# Patient Record
Sex: Male | Born: 1965 | Race: White | Hispanic: Yes | Marital: Married
Health system: Southern US, Community
[De-identification: ages and names within clinical notes are randomized; demographics above are authoritative.]

## PROBLEM LIST (undated history)

## (undated) HISTORY — PX: BACK SURGERY: SHX140

---

## 2000-06-10 ENCOUNTER — Emergency Department (HOSPITAL_COMMUNITY): Admission: EM | Admit: 2000-06-10 | Discharge: 2000-06-10 | Payer: Self-pay | Admitting: Emergency Medicine

## 2003-02-11 ENCOUNTER — Encounter: Payer: Self-pay | Admitting: Internal Medicine

## 2003-02-11 ENCOUNTER — Ambulatory Visit (HOSPITAL_COMMUNITY): Admission: RE | Admit: 2003-02-11 | Discharge: 2003-02-11 | Payer: Self-pay | Admitting: Internal Medicine

## 2003-02-14 ENCOUNTER — Encounter: Admission: RE | Admit: 2003-02-14 | Discharge: 2003-02-14 | Payer: Self-pay | Admitting: *Deleted

## 2003-02-14 ENCOUNTER — Encounter: Payer: Self-pay | Admitting: Internal Medicine

## 2008-09-30 ENCOUNTER — Ambulatory Visit (HOSPITAL_BASED_OUTPATIENT_CLINIC_OR_DEPARTMENT_OTHER): Admission: RE | Admit: 2008-09-30 | Discharge: 2008-09-30 | Payer: Self-pay | Admitting: Family Medicine

## 2008-09-30 ENCOUNTER — Ambulatory Visit: Payer: Self-pay | Admitting: Diagnostic Radiology

## 2010-02-12 ENCOUNTER — Ambulatory Visit (HOSPITAL_COMMUNITY)
Admission: RE | Admit: 2010-02-12 | Discharge: 2010-02-13 | Payer: Self-pay | Source: Home / Self Care | Admitting: Neurosurgery

## 2010-10-06 LAB — CBC
HCT: 46.1 % (ref 39.0–52.0)
MCV: 86.5 fL (ref 78.0–100.0)
RDW: 12.4 % (ref 11.5–15.5)
WBC: 7.1 10*3/uL (ref 4.0–10.5)

## 2010-10-06 LAB — SURGICAL PCR SCREEN: Staphylococcus aureus: POSITIVE — AB

## 2011-03-09 IMAGING — CR DG LUMBAR SPINE 2-3V
1 series · 1 of 1 positions shown · non-contrast
Comparison: None.

CLINICAL DATA: Right L5-S1 laminectomy.

LUMBAR SPINE - 2-3 VIEW

[view not recorded]
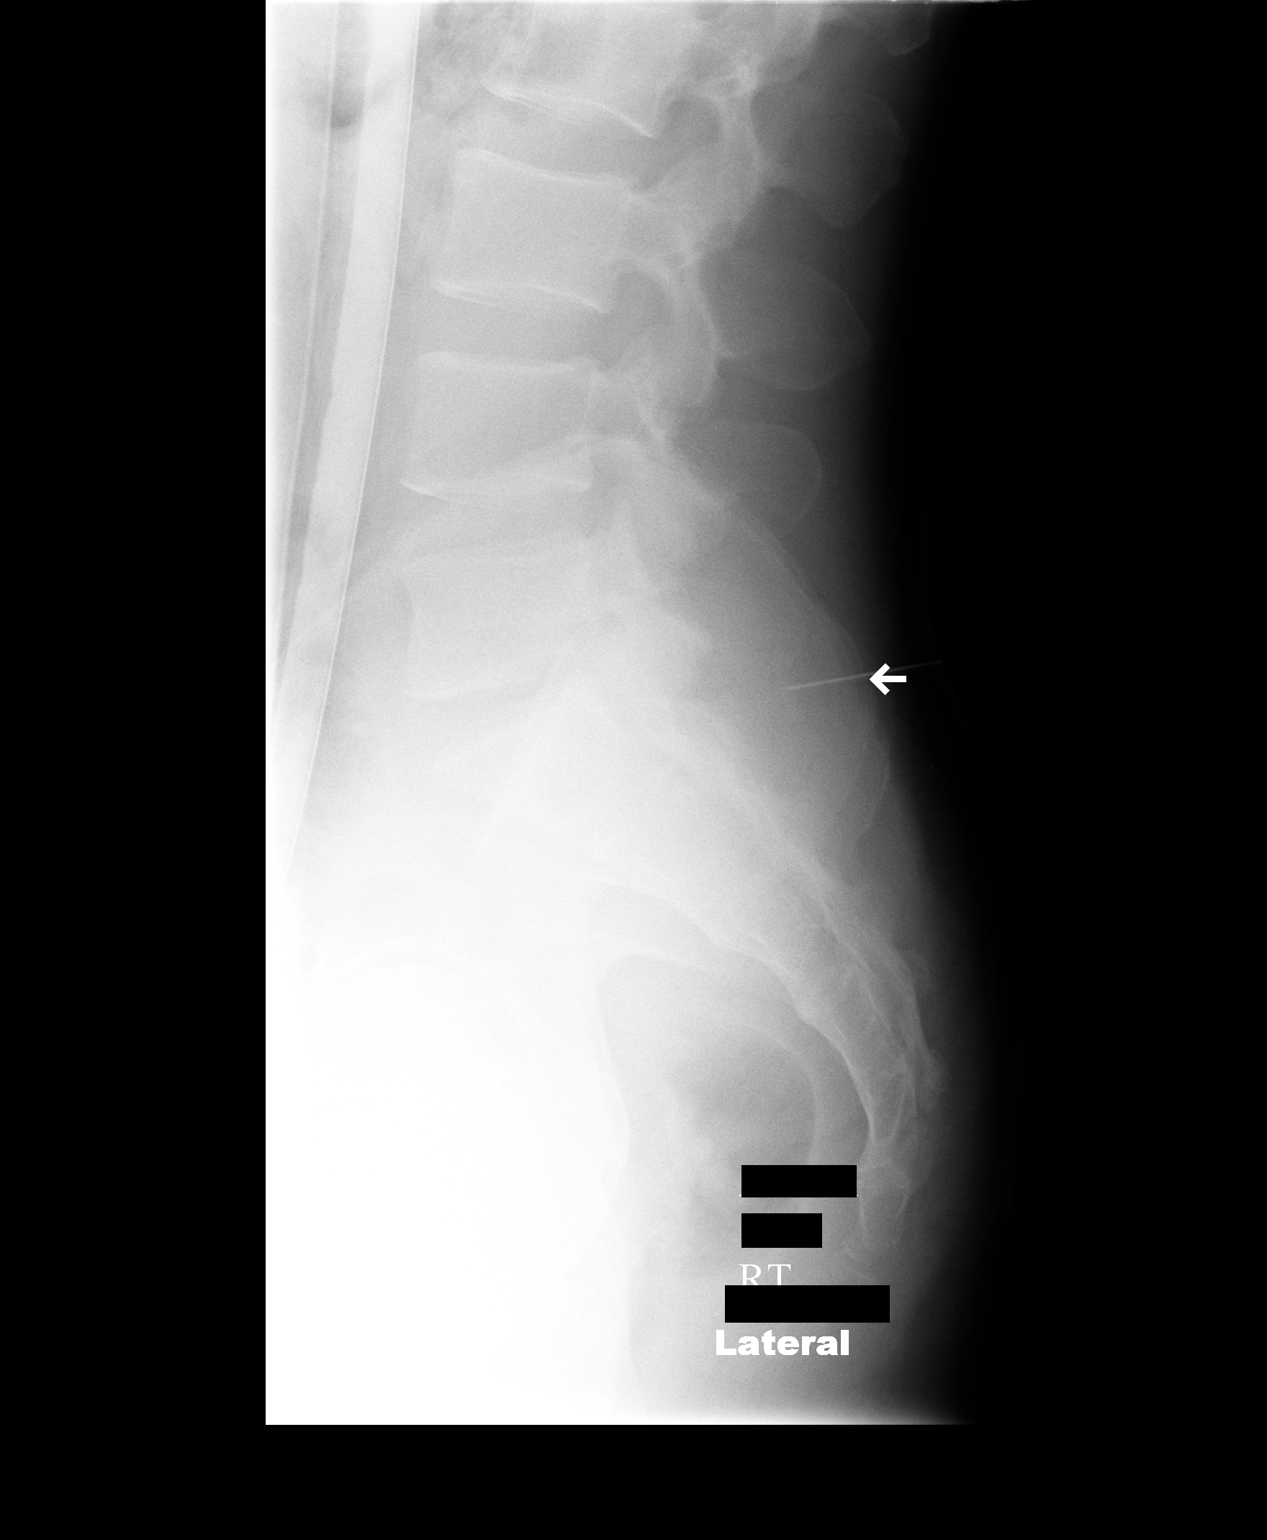

[1 of 1 positions shown; findings below may reference images not displayed]

FINDINGS: Two intraoperative cross-table lateral views of the
lumbar spine are submitted.  The first film taken at 1006 hours
shows a surgical probe tip projecting posterior to the S1 vertebral
body.  The second film taken at approximately 5046 hours shows a
surgical probe tip projecting posterior to the L5-S1 facet joint.
Osseous detail is degraded by technique.
IMPRESSION: Intraoperative localization for L5 S1 laminectomy.

## 2021-08-17 ENCOUNTER — Emergency Department (HOSPITAL_BASED_OUTPATIENT_CLINIC_OR_DEPARTMENT_OTHER)
Admission: EM | Admit: 2021-08-17 | Discharge: 2021-08-17 | Disposition: A | Discharge status: Home / Self Care | Payer: 59 | Attending: Emergency Medicine | Admitting: Emergency Medicine

## 2021-08-17 ENCOUNTER — Other Ambulatory Visit: Payer: Self-pay

## 2021-08-17 ENCOUNTER — Encounter (HOSPITAL_BASED_OUTPATIENT_CLINIC_OR_DEPARTMENT_OTHER): Payer: Self-pay | Admitting: *Deleted

## 2021-08-17 DIAGNOSIS — X58XXXA Exposure to other specified factors, initial encounter: Secondary | ICD-10-CM | POA: Diagnosis not present

## 2021-08-17 DIAGNOSIS — M542 Cervicalgia: Secondary | ICD-10-CM | POA: Diagnosis present

## 2021-08-17 DIAGNOSIS — S161XXA Strain of muscle, fascia and tendon at neck level, initial encounter: Secondary | ICD-10-CM | POA: Diagnosis not present

## 2021-08-17 MED ORDER — KETOROLAC TROMETHAMINE 15 MG/ML IJ SOLN
15.0000 mg | Freq: Once | INTRAMUSCULAR | Status: AC
Start: 2021-08-17 — End: 2021-08-17
  Administered 2021-08-17: 15 mg via INTRAMUSCULAR
  Filled 2021-08-17: qty 1

## 2021-08-17 MED ORDER — METHOCARBAMOL 500 MG PO TABS: 500.0000 mg | ORAL_TABLET | Freq: Two times a day (BID) | ORAL | 0 refills | Status: AC

## 2021-08-17 MED ORDER — NAPROXEN 375 MG PO TABS: 375.0000 mg | ORAL_TABLET | Freq: Two times a day (BID) | ORAL | 0 refills | Status: AC

## 2021-08-17 NOTE — ED Triage Notes (Signed)
C/o neck pain with movt x 4 days, denies injury

## 2021-08-17 NOTE — ED Provider Notes (Signed)
[facility] Provider Note   CSN: [id number] Arrival date & time: 08/17/21  1612     History  Chief Complaint  Patient presents with   Neck Pain    Hunter Boyd is a 56 y.o. male.  56 year old male presents today for evaluation of left-sided neck pain of 4-day duration.  Patient reports he gets this type of pain couple times a year over the past 5 to 6 years.  Patient recalls this pain started upon waking up 4 days ago.  He describes the pain as tightness which makes it difficult for him to turn his head.  He denies fever, chills, headache, visual change, paresthesias in either upper extremities, weakness in upper extremities.  He has tried ibuprofen with mild relief.  Denies IV drug use history, unintentional weight loss, or history of personal malignancy.  The history is provided by the patient. No language interpreter was used.      Home Medications Prior to Admission medications   Not on File      Allergies    Penicillins    Review of Systems   Review of Systems  Constitutional:  Negative for chills, diaphoresis, fever and unexpected weight change.  HENT:  Negative for sore throat and trouble swallowing.   Eyes:  Negative for photophobia and visual disturbance.  Musculoskeletal:  Positive for neck pain and neck stiffness.  All other systems reviewed and are negative.  Physical Exam Updated Vital Signs BP (!) 158/98 (BP Location: Right Arm)    Pulse (!) 59    Temp 98 F (36.7 C) (Oral)    Resp 18    Ht 5\' 11"  (1.803 m)    Wt 104.3 kg    SpO2 100%    BMI 32.08 kg/m  Physical Exam Vitals and nursing note reviewed.  Constitutional:      General: He is not in acute distress.    Appearance: Normal appearance. He is not ill-appearing.  HENT:     Head: Normocephalic and atraumatic.     Nose: Nose normal.  Eyes:     Conjunctiva/sclera: Conjunctivae normal.  Neck:     Comments: Cervical, thoracic, lumbar spine without tenderness to  palpation.  Bilateral shoulders without tenderness to palpation.  Full upper extremity active range of motion.  Upper extremity and extensor, flexor muscle groups 5/5 as well as grip strength.  Sensation is symmetrical.  2+ radial pulses present.  Neck without reproducible pain on exam. Cardiovascular:     Rate and Rhythm: Normal rate and regular rhythm.  Pulmonary:     Effort: Pulmonary effort is normal. No respiratory distress.     Breath sounds: Normal breath sounds. No wheezing.  Musculoskeletal:        General: No deformity. Normal range of motion.     Cervical back: Neck supple. No tenderness.  Lymphadenopathy:     Cervical: No cervical adenopathy.  Skin:    Findings: No rash.  Neurological:     Mental Status: He is alert.    ED Results / Procedures / Treatments   Labs (all labs ordered are listed, but only abnormal results are displayed) Labs Reviewed - No data to display  EKG None  Radiology No results found.  Procedures Procedures    Medications Ordered in ED Medications - No data to display  ED Course/ Medical Decision Making/ A&P  Medical Decision Making  Medical Decision Making / ED Course   This patient presents to the ED for concern of left-sided neck pain and tightness of 4-day duration, this involves an extensive number of treatment options, and is a complaint that carries with it a high risk of complications and morbidity.  The differential diagnosis includes muscle strain, cervical spine fracture, cervical radiculopathy, spinal epidural abscess  MDM: 56 year old male presents today for evaluation of left-sided neck pain of 4-day duration.  He has taken ibuprofen without relief.  Patient reports similar history over the past 5 or 6 years the last 1 to 2 days.  Patient recalls waking up with this pain 4 days ago.  Patient reports from time to time he does sleep on a couch.  Patient describes this pain as tightness limiting his  range of motion.  He is without any red flag signs or symptoms concerning for spinal epidural abscess.  Patient without cervical spine tenderness.  Patient likely has a muscular strain.  Will provide Robaxin, naproxen.  Return precautions discussed with patient.  Patient voices understanding and is in agreement with plan.  Lab Tests: -I ordered, reviewed, and interpreted labs.   The pertinent results include:   Labs Reviewed - No data to display    EKG  EKG Interpretation  Date/Time:    Ventricular Rate:    PR Interval:    QRS Duration:   QT Interval:    QTC Calculation:   R Axis:     Text Interpretation:           Medicines ordered and prescription drug management: No orders of the defined types were placed in this encounter.   -I have reviewed the patients home medicines and have made adjustments as needed  Social Determinants of Health:  Factors impacting patients care include: Patient does not have primary care provider.  We will provide resources to establish primary care provider.   Reevaluation: After the interventions noted above, I reevaluated the patient and found that they have :stayed the same  Co morbidities that complicate the patient evaluation History reviewed. No pertinent past medical history.    Dispostion: Patient given Toradol in the emergency room.  Patient is appropriate for discharge.  Return precautions discussed.  Patient discharged in stable condition.   Final Clinical Impression(s) / ED Diagnoses Final diagnoses:  Acute strain of neck muscle, initial encounter    Rx / DC Orders ED Discharge Orders          Ordered    naproxen (NAPROSYN) 375 MG tablet  2 times daily        08/17/21 1658    methocarbamol (ROBAXIN) 500 MG tablet  2 times daily        08/17/21 1658              Evlyn Courier, PA-C 08/17/21 1700    Truddie Hidden, MD 08/17/21 1714

## 2021-08-17 NOTE — Discharge Instructions (Addendum)
You likely have cervical strain.  I have sent in a muscle relaxer as well as naproxen to the pharmacy for you.  I have also attached a resource for you to establish a primary care provider.  If you develop worsening symptoms, fever, weakness in your upper extremities please return to the emergency room.
